# Patient Record
Sex: Female | Born: 1985 | Race: Black or African American | Hispanic: No | Marital: Single | Smoking: Current every day smoker
Health system: Southern US, Community
[De-identification: ages and names within clinical notes are randomized; demographics above are authoritative.]

---

## 2007-11-05 ENCOUNTER — Emergency Department: Payer: Self-pay | Admitting: Emergency Medicine

## 2008-03-11 ENCOUNTER — Emergency Department: Payer: Self-pay | Admitting: Emergency Medicine

## 2008-05-01 ENCOUNTER — Ambulatory Visit: Payer: Self-pay | Admitting: Family Medicine

## 2008-09-04 ENCOUNTER — Inpatient Hospital Stay: Payer: Self-pay | Admitting: Obstetrics and Gynecology

## 2008-12-22 ENCOUNTER — Emergency Department: Payer: Self-pay | Admitting: Emergency Medicine

## 2008-12-27 ENCOUNTER — Emergency Department: Payer: Self-pay | Admitting: Emergency Medicine

## 2009-10-07 ENCOUNTER — Emergency Department: Payer: Self-pay | Admitting: Emergency Medicine

## 2010-04-18 ENCOUNTER — Emergency Department: Payer: Self-pay | Admitting: Emergency Medicine
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
## 2019-11-19 DIAGNOSIS — N3001 Acute cystitis with hematuria: Secondary | ICD-10-CM | POA: Insufficient documentation

## 2019-11-19 DIAGNOSIS — Z202 Contact with and (suspected) exposure to infections with a predominantly sexual mode of transmission: Secondary | ICD-10-CM | POA: Insufficient documentation

## 2019-11-19 DIAGNOSIS — F17210 Nicotine dependence, cigarettes, uncomplicated: Secondary | ICD-10-CM | POA: Insufficient documentation

## 2019-11-20 ENCOUNTER — Other Ambulatory Visit: Payer: Self-pay

## 2019-11-20 ENCOUNTER — Emergency Department
Admission: EM | Admit: 2019-11-20 | Discharge: 2019-11-20 | Disposition: A | Discharge status: Home / Self Care | Payer: Self-pay | Attending: Emergency Medicine | Admitting: Emergency Medicine

## 2019-11-20 ENCOUNTER — Encounter: Payer: Self-pay | Admitting: Emergency Medicine

## 2019-11-20 DIAGNOSIS — Z202 Contact with and (suspected) exposure to infections with a predominantly sexual mode of transmission: Secondary | ICD-10-CM

## 2019-11-20 DIAGNOSIS — N3001 Acute cystitis with hematuria: Secondary | ICD-10-CM

## 2019-11-20 LAB — BASIC METABOLIC PANEL WITH GFR
Anion gap: 7 (ref 5–15)
BUN: 9 mg/dL (ref 6–20)
CO2: 27 mmol/L (ref 22–32)
Calcium: 9.4 mg/dL (ref 8.9–10.3)
Chloride: 103 mmol/L (ref 98–111)
Creatinine, Ser: 0.68 mg/dL (ref 0.44–1.00)
GFR, Estimated: >60 mL/min
Glucose, Bld: 108 mg/dL — ABNORMAL HIGH (ref 70–99)
Potassium: 3.3 mmol/L — ABNORMAL LOW (ref 3.5–5.1)
Sodium: 137 mmol/L (ref 135–145)

## 2019-11-20 LAB — URINALYSIS, COMPLETE (UACMP) WITH MICROSCOPIC
Bacteria, UA: NONE SEEN
Bilirubin Urine: NEGATIVE
Glucose, UA: NEGATIVE mg/dL
Hgb urine dipstick: NEGATIVE
Ketones, ur: NEGATIVE mg/dL
Nitrite: NEGATIVE
Protein, ur: 30 mg/dL — AB
Specific Gravity, Urine: 1.024 (ref 1.005–1.030)
WBC, UA: >50 WBC/hpf — ABNORMAL HIGH (ref 0–5)
pH: 5 (ref 5.0–8.0)

## 2019-11-20 LAB — RAPID HIV SCREEN (HIV 1/2 AB+AG)
HIV 1/2 Antibodies: NONREACTIVE
HIV-1 P24 Antigen - HIV24: NONREACTIVE

## 2019-11-20 LAB — CHLAMYDIA/NGC RT PCR (ARMC ONLY)
Chlamydia Tr: NOT DETECTED
N gonorrhoeae: NOT DETECTED

## 2019-11-20 LAB — CBC
HCT: 31.3 % — ABNORMAL LOW (ref 36.0–46.0)
Hemoglobin: 9.9 g/dL — ABNORMAL LOW (ref 12.0–15.0)
MCH: 24.9 pg — ABNORMAL LOW (ref 26.0–34.0)
MCHC: 31.6 g/dL (ref 30.0–36.0)
MCV: 78.6 fL — ABNORMAL LOW (ref 80.0–100.0)
Platelets: 532 10*3/uL — ABNORMAL HIGH (ref 150–400)
RBC: 3.98 MIL/uL (ref 3.87–5.11)
RDW: 15.4 % (ref 11.5–15.5)
WBC: 8.5 10*3/uL (ref 4.0–10.5)
nRBC: 0 % (ref 0.0–0.2)

## 2019-11-20 LAB — POC URINE PREG, ED: Preg Test, Ur: NEGATIVE

## 2019-11-20 MED ORDER — CEFTRIAXONE SODIUM 1 G IJ SOLR
500.0000 mg | Freq: Once | INTRAMUSCULAR | Status: AC
  Administered 2019-11-20: 500 mg via INTRAMUSCULAR
  Filled 2019-11-20: qty 10

## 2019-11-20 MED ORDER — METRONIDAZOLE 500 MG PO TABS
500.0000 mg | ORAL_TABLET | Freq: Once | ORAL | Status: AC
  Administered 2019-11-20: 500 mg via ORAL
  Filled 2019-11-20: qty 1

## 2019-11-20 MED ORDER — DOXYCYCLINE HYCLATE 100 MG PO CAPS
100.0000 mg | ORAL_CAPSULE | Freq: Two times a day (BID) | ORAL | 0 refills | Status: AC
Start: 2019-11-20 — End: 2019-11-27

## 2019-11-20 MED ORDER — METRONIDAZOLE 500 MG PO TABS: 500.0000 mg | ORAL_TABLET | Freq: Two times a day (BID) | ORAL | 0 refills | Status: DC

## 2019-11-20 MED ORDER — CEPHALEXIN 500 MG PO CAPS
500.0000 mg | ORAL_CAPSULE | Freq: Two times a day (BID) | ORAL | 0 refills | Status: DC
Start: 2019-11-20 — End: 2020-01-02

## 2019-11-20 MED ORDER — DOXYCYCLINE HYCLATE 100 MG PO TABS
100.0000 mg | ORAL_TABLET | Freq: Once | ORAL | Status: AC
  Administered 2019-11-20: 100 mg via ORAL
  Filled 2019-11-20: qty 1

## 2019-11-20 MED ORDER — ONDANSETRON 4 MG PO TBDP: ORAL_TABLET | ORAL | 0 refills | Status: DC

## 2019-11-20 NOTE — ED Notes (Signed)
Pt informed of discharge instructions, prescriptions, and follow-up. Pt verbalized understanding. Pt ambulatory upon discharge.

## 2019-11-20 NOTE — Discharge Instructions (Signed)
As we discussed, we are treating you for urinary tract infection, but also treating you and tested you for sexually transmitted diseases.  Please complete the full course of antibiotics prescribed; do not stop taking the medications before they are gone.  Sometimes they can make you feel sick to her stomach so we also prescribed a nausea medication (ondansetron, or Zofran).  Remember that although you should be completely treated for the most common sexually transmitted diseases at this time, you should follow-up in MyChart using the instructions provided in your discharge paperwork to see your results.  You can follow-up at the [facility] department as well for additional evaluation and treatment.  Even though you have been treated now, you can contract any of the potential diseases again from an infected partner, so please always use condoms when having sex.

## 2019-11-20 NOTE — ED Triage Notes (Signed)
Pt arrived via POV with c/o dysuria and back pain x 3-4 days. Pt c/o urinary urgency and frequency and discomfort.

## 2019-11-20 NOTE — ED Notes (Signed)
Pt reports burning and increased frequency in urination for 3-4 days. Pt reports feeling of not fully emptying her bladder after urination. Pt also reports pain in back rated 7/10. Pain is intermittently mostly after urinating.
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
## 2019-11-20 NOTE — ED Notes (Signed)
Pt refusing lab draw for HIV and RPR. MD notified. Pt informed there is no option here for oral swab test for HIV.

## 2020-01-02 ENCOUNTER — Encounter: Payer: Self-pay | Admitting: Emergency Medicine

## 2020-01-02 ENCOUNTER — Other Ambulatory Visit: Payer: Self-pay

## 2020-01-02 ENCOUNTER — Emergency Department
Admission: EM | Admit: 2020-01-02 | Discharge: 2020-01-02 | Disposition: A | Discharge status: Home / Self Care | Payer: Self-pay | Attending: Emergency Medicine | Admitting: Emergency Medicine

## 2020-01-02 DIAGNOSIS — Z760 Encounter for issue of repeat prescription: Secondary | ICD-10-CM

## 2020-01-02 DIAGNOSIS — F17210 Nicotine dependence, cigarettes, uncomplicated: Secondary | ICD-10-CM | POA: Insufficient documentation

## 2020-01-02 MED ORDER — METRONIDAZOLE 500 MG PO TABS
500.0000 mg | ORAL_TABLET | Freq: Two times a day (BID) | ORAL | 0 refills | Status: AC
Start: 2020-01-02 — End: ?

## 2020-01-02 MED ORDER — ONDANSETRON 4 MG PO TBDP: ORAL_TABLET | ORAL | 0 refills | Status: AC

## 2020-01-02 MED ORDER — CEPHALEXIN 500 MG PO CAPS
500.0000 mg | ORAL_CAPSULE | Freq: Two times a day (BID) | ORAL | 0 refills | Status: AC
Start: 2020-01-02 — End: ?
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
## 2020-01-02 NOTE — ED Notes (Signed)
Called patient cell phone to inform her that she had a bed ready, no answer.

## 2020-01-02 NOTE — ED Triage Notes (Signed)
FIRST NURSE NOTE:  Pt here with reports of needing 3 medications refilled because she was told the prescriptions were expired. Pt does not know the name of the medications.

## 2020-01-02 NOTE — ED Notes (Signed)
Observed patient walk out front doors.

## 2020-01-02 NOTE — ED Triage Notes (Signed)
Patient states that she was seen here and was given three prescriptions. Patient states that when she went to pick them up that they had expired. The patient states that they were for a UTI, bacterial infection and she thinks for nausea.

## 2020-01-02 NOTE — ED Triage Notes (Signed)
This pt called prior to arrival requesting her prescriptions, that were sent to [facility] pharmacy post discharge on 10/31 by York Cerise, MD, to be resent to pharmacy because she did not pick up all medications, "only a few," per pt. Pt unable to understand that she would need to contact the pharmacy ([facility]) to ensure the prescription is still available.

## 2020-01-02 NOTE — ED Provider Notes (Signed)
[facility] Emergency Department Provider Note   ____________________________________________   Event Date/Time   First MD Initiated Contact with Patient 01/02/20 [phone]     (approximate)  I have reviewed the triage vital signs and the nursing notes.   HISTORY  Chief Complaint Medication Refill    HPI Lindsay Foster is a 34 y.o. female with no stated past medical history presents stating that she needs a medication refill.  Patient states that she was seen in our emergency department approximately 1 month prior to arrival and was prescribed antibiotics for a presumed urinary tract infection as well as a sexually transmitted infection however she never picked up these prescriptions.  Patient states that symptoms got better and she felt that she did not need his antibiotics however over the past few days the symptoms have worsened again.  Patient states that she just needs to have these medications refilled as the [facility] could not find them.  Patient currently denies any vision changes, tinnitus, difficulty speaking, facial droop, sore throat, chest pain, shortness of breath, abdominal pain, nausea/vomiting/diarrhea, or weakness/numbness/paresthesias in any extremity         History reviewed. No pertinent past medical history.  There are no problems to display for this patient.   History reviewed. No pertinent surgical history.  Prior to Admission medications   Medication Sig Start Date End Date Taking? Authorizing Provider  cephALEXin (KEFLEX) 500 MG capsule Take 1 capsule (500 mg total) by mouth 2 (two) times daily. 01/02/20   Merwyn Katos, MD  metroNIDAZOLE (FLAGYL) 500 MG tablet Take 1 tablet (500 mg total) by mouth 2 (two) times daily. 01/02/20   Merwyn Katos, MD  ondansetron (ZOFRAN ODT) 4 MG disintegrating tablet Allow 1-2 tablets to dissolve in your mouth every 8 hours as needed for nausea/vomiting 01/02/20   Merwyn Katos, MD     Allergies Patient has no known allergies.  No family history on file.  Social History Social History   Tobacco Use  . Smoking status: Current Every Day Smoker    Packs/day: 0.25    Types: Cigarettes  . Smokeless tobacco: Never Used    Review of Systems Constitutional: No fever/chills Eyes: No visual changes. ENT: No sore throat. Cardiovascular: Denies chest pain. Respiratory: Denies shortness of breath. Gastrointestinal: No abdominal pain.  No nausea, no vomiting.  No diarrhea. Genitourinary: Positive for dysuria. Musculoskeletal: Negative for acute arthralgias Skin: Negative for rash. Neurological: Negative for headaches, weakness/numbness/paresthesias in any extremity Psychiatric: Negative for suicidal ideation/homicidal ideation   ____________________________________________   PHYSICAL EXAM:  VITAL SIGNS: ED Triage Vitals  Enc Vitals Group     BP 01/02/20 0308 (!) 120/59     Pulse Rate 01/02/20 0308 (!) 114     Resp 01/02/20 0308 18     Temp 01/02/20 0308 98.8 F (37.1 C)     Temp Source 01/02/20 0308 Oral     SpO2 01/02/20 0308 100 %     Weight 01/02/20 0309 198 lb (89.8 kg)     Height 01/02/20 0309 5\' 4"  (1.626 m)     Head Circumference --      Peak Flow --      Pain Score 01/02/20 0309 4     Pain Loc --      Pain Edu? --      Excl. in GC? --    Constitutional: Alert and oriented. Well appearing and in no acute distress. Eyes: Conjunctivae are normal. PERRL. Head: Atraumatic. Nose:  No congestion/rhinnorhea. Mouth/Throat: Mucous membranes are moist. Neck: No stridor Cardiovascular: Grossly normal heart sounds.  Good peripheral circulation. Respiratory: Normal respiratory effort.  No retractions. Gastrointestinal: Soft and nontender. No distention. Musculoskeletal: No obvious deformities Neurologic:  Normal speech and language. No gross focal neurologic deficits are appreciated. Skin:  Skin is warm and dry. No rash noted. Psychiatric: Mood and  affect are normal. Speech and behavior are normal.  ____________________________________________   LABS (all labs ordered are listed, but only abnormal results are displayed)  Labs Reviewed - No data to display ____________________________________________  EKG  PROCEDURES  Procedure(s) performed (including Critical Care):  Procedures   ____________________________________________   INITIAL IMPRESSION / ASSESSMENT AND PLAN / ED COURSE  As part of my medical decision making, I reviewed the following data within the electronic MEDICAL RECORD NUMBER Nursing notes reviewed and incorporated, Labs reviewed, EKG interpreted, Old chart reviewed, Radiograph reviewed and Notes from prior ED visits reviewed and incorporated        Not Pregnant. Unlikely TOA, Ovarian Torsion, PID, gonorrhea/chlamydia. Low suspicion for Infected Urolithiasis, AAA, Cholecystitis, Pancreatitis, SBO, Appendicitis, or other acute abdomen. Medications refilled and sent to patient pharmacy Disposition: Discharge home. SRP discussed. Advise follow up with primary care provider within 24-72 hours.      ____________________________________________   FINAL CLINICAL IMPRESSION(S) / ED DIAGNOSES  Final diagnoses:  Encounter for medication refill     ED Discharge Orders         Ordered    cephALEXin (KEFLEX) 500 MG capsule  2 times daily        01/02/20 0330    metroNIDAZOLE (FLAGYL) 500 MG tablet  2 times daily        01/02/20 0330    ondansetron (ZOFRAN ODT) 4 MG disintegrating tablet        01/02/20 0330           Note:  This document was prepared using Dragon voice recognition software and may include unintentional dictation errors.   Merwyn Katos, MD 01/02/20 [phone]
